# Patient Record
Sex: Female | Born: 1995 | Race: White | Hispanic: No | Marital: Single | Smoking: Never smoker
Health system: Southern US, Community
[De-identification: ages and names within clinical notes are randomized; demographics above are authoritative.]

---

## 2015-06-24 ENCOUNTER — Emergency Department (HOSPITAL_COMMUNITY): Payer: No Typology Code available for payment source

## 2015-06-24 ENCOUNTER — Emergency Department (HOSPITAL_COMMUNITY)
Admission: EM | Admit: 2015-06-24 | Discharge: 2015-06-24 | Disposition: A | Discharge status: Home / Self Care | Payer: No Typology Code available for payment source | Attending: Emergency Medicine | Admitting: Emergency Medicine

## 2015-06-24 ENCOUNTER — Encounter (HOSPITAL_COMMUNITY): Payer: Self-pay | Admitting: *Deleted

## 2015-06-24 DIAGNOSIS — Z793 Long term (current) use of hormonal contraceptives: Secondary | ICD-10-CM | POA: Insufficient documentation

## 2015-06-24 DIAGNOSIS — Z3202 Encounter for pregnancy test, result negative: Secondary | ICD-10-CM | POA: Diagnosis not present

## 2015-06-24 DIAGNOSIS — E860 Dehydration: Secondary | ICD-10-CM | POA: Insufficient documentation

## 2015-06-24 DIAGNOSIS — R55 Syncope and collapse: Secondary | ICD-10-CM | POA: Diagnosis present

## 2015-06-24 LAB — CBC WITH DIFFERENTIAL/PLATELET
Basophils Absolute: 0 10*3/uL (ref 0.0–0.1)
Basophils Relative: 0 % (ref 0–1)
EOS PCT: 1 % (ref 0–5)
Eosinophils Absolute: 0 10*3/uL (ref 0.0–0.7)
HEMATOCRIT: 33.2 % — AB (ref 36.0–46.0)
Hemoglobin: 10.8 g/dL — ABNORMAL LOW (ref 12.0–15.0)
LYMPHS ABS: 1.3 10*3/uL (ref 0.7–4.0)
Lymphocytes Relative: 18 % (ref 12–46)
MCH: 30.1 pg (ref 26.0–34.0)
MCHC: 32.5 g/dL (ref 30.0–36.0)
MCV: 92.5 fL (ref 78.0–100.0)
Monocytes Absolute: 0.3 10*3/uL (ref 0.1–1.0)
Monocytes Relative: 5 % (ref 3–12)
NEUTROS ABS: 5.2 10*3/uL (ref 1.7–7.7)
Neutrophils Relative %: 76 % (ref 43–77)
Platelets: 331 10*3/uL (ref 150–400)
RBC: 3.59 MIL/uL — ABNORMAL LOW (ref 3.87–5.11)
RDW: 13.7 % (ref 11.5–15.5)
WBC: 6.9 10*3/uL (ref 4.0–10.5)

## 2015-06-24 LAB — BASIC METABOLIC PANEL
Anion gap: 8 (ref 5–15)
BUN: 14 mg/dL (ref 6–20)
CO2: 25 mmol/L (ref 22–32)
CREATININE: 0.63 mg/dL (ref 0.44–1.00)
Calcium: 8.7 mg/dL — ABNORMAL LOW (ref 8.9–10.3)
Chloride: 104 mmol/L (ref 101–111)
GFR calc non Af Amer: >60 mL/min (ref >60)
Glucose, Bld: 104 mg/dL — ABNORMAL HIGH (ref 65–99)
Potassium: 3.9 mmol/L (ref 3.5–5.1)
SODIUM: 137 mmol/L (ref 135–145)

## 2015-06-24 LAB — URINALYSIS, ROUTINE W REFLEX MICROSCOPIC
Bilirubin Urine: NEGATIVE
GLUCOSE, UA: NEGATIVE mg/dL
HGB URINE DIPSTICK: NEGATIVE
Ketones, ur: NEGATIVE mg/dL
Leukocytes, UA: NEGATIVE
Nitrite: NEGATIVE
PH: 5.5 (ref 5.0–8.0)
Protein, ur: NEGATIVE mg/dL
Specific Gravity, Urine: 1.025 (ref 1.005–1.030)
Urobilinogen, UA: 0.2 mg/dL (ref 0.0–1.0)

## 2015-06-24 LAB — POC URINE PREG, ED: Preg Test, Ur: NEGATIVE

## 2015-06-24 MED ORDER — SODIUM CHLORIDE 0.9 % IV BOLUS (SEPSIS)
1000.0000 mL | Freq: Once | INTRAVENOUS | Status: AC
  Administered 2015-06-24: 1000 mL via INTRAVENOUS

## 2015-06-24 NOTE — Discharge Instructions (Signed)
Dehydration, Adult °Dehydration is when you lose more fluids from the body than you take in. Vital organs like the kidneys, brain, and heart cannot function without a proper amount of fluids and salt. Any loss of fluids from the body can cause dehydration.  °CAUSES  °· Vomiting. °· Diarrhea. °· Excessive sweating. °· Excessive urine output. °· Fever. °SYMPTOMS  °Mild dehydration °· Thirst. °· Dry lips. °· Slightly dry mouth. °Moderate dehydration °· Very dry mouth. °· Sunken eyes. °· Skin does not bounce back quickly when lightly pinched and released. °· Dark urine and decreased urine production. °· Decreased tear production. °· Headache. °Severe dehydration °· Very dry mouth. °· Extreme thirst. °· Rapid, weak pulse (more than 100 beats per minute at rest). °· Cold hands and feet. °· Not able to sweat in spite of heat and temperature. °· Rapid breathing. °· Blue lips. °· Confusion and lethargy. °· Difficulty being awakened. °· Minimal urine production. °· No tears. °DIAGNOSIS  °Your caregiver will diagnose dehydration based on your symptoms and your exam. Blood and urine tests will help confirm the diagnosis. The diagnostic evaluation should also identify the cause of dehydration. °TREATMENT  °Treatment of mild or moderate dehydration can often be done at home by increasing the amount of fluids that you drink. It is best to drink small amounts of fluid more often. Drinking too much at one time can make vomiting worse. Refer to the home care instructions below. °Severe dehydration needs to be treated at the hospital where you will probably be given intravenous (IV) fluids that contain water and electrolytes. °HOME CARE INSTRUCTIONS  °· Ask your caregiver about specific rehydration instructions. °· Drink enough fluids to keep your urine clear or pale yellow. °· Drink small amounts frequently if you have nausea and vomiting. °· Eat as you normally do. °· Avoid: °¨ Foods or drinks high in sugar. °¨ Carbonated  drinks. °¨ Juice. °¨ Extremely hot or cold fluids. °¨ Drinks with caffeine. °¨ Fatty, greasy foods. °¨ Alcohol. °¨ Tobacco. °¨ Overeating. °¨ Gelatin desserts. °· Wash your hands well to avoid spreading bacteria and viruses. °· Only take over-the-counter or prescription medicines for pain, discomfort, or fever as directed by your caregiver. °· Ask your caregiver if you should continue all prescribed and over-the-counter medicines. °· Keep all follow-up appointments with your caregiver. °SEEK MEDICAL CARE IF: °· You have abdominal pain and it increases or stays in one area (localizes). °· You have a rash, stiff neck, or severe headache. °· You are irritable, sleepy, or difficult to awaken. °· You are weak, dizzy, or extremely thirsty. °SEEK IMMEDIATE MEDICAL CARE IF:  °· You are unable to keep fluids down or you get worse despite treatment. °· You have frequent episodes of vomiting or diarrhea. °· You have blood or green matter (bile) in your vomit. °· You have blood in your stool or your stool looks black and tarry. °· You have not urinated in 6 to 8 hours, or you have only urinated a small amount of very dark urine. °· You have a fever. °· You faint. °MAKE SURE YOU:  °· Understand these instructions. °· Will watch your condition. °· Will get help right away if you are not doing well or get worse. °Document Released: 12/06/2005 Document Revised: 02/28/2012 Document Reviewed: 07/26/2011 °ExitCare® Patient Information ©2015 [employer]. This information is not intended to replace advice given to you by your health care provider. Make sure you discuss any questions you have with your health care   provider.  Near-Syncope Near-syncope (commonly known as near fainting) is sudden weakness, dizziness, or feeling like you might pass out. This can happen when getting up or while standing for a long time. It is caused by a sudden decrease in blood flow to the brain, which can occur for various reasons. Most of the reasons  are not serious.  HOME CARE Watch your condition for any changes.  Have someone stay with you until you feel stable.  If you feel like you are going to pass out:  Lie down right away.  Prop your feet up if you can.  Breathe deeply and steadily.  Move only when the feeling has gone away. Most of the time, this feeling lasts only a few minutes. You may feel tired for several hours.  Drink enough fluids to keep your pee (urine) clear or pale yellow.  If you are taking blood pressure or heart medicine, stand up slowly.  Follow up with your doctor as told. GET HELP RIGHT AWAY IF:   You have a severe headache.  You have unusual pain in the chest, belly (abdomen), or back.  You have bleeding from the mouth or butt (rectum), or you have black or tarry poop (stool).  You feel your heart beat differently than normal, or you have a very fast pulse.  You pass out, or you twitch and shake when you pass out.  You pass out when sitting or lying down.  You feel confused.  You have trouble walking.  You are weak.  You have vision problems. MAKE SURE YOU:   Understand these instructions.  Will watch your condition.  Will get help right away if you are not doing well or get worse. Document Released: 05/24/2008 Document Revised: 12/11/2013 Document Reviewed: 05/11/2013 [facility] Patient Information 2015 [place], [place]. This information is not intended to replace advice given to you by your health care provider. Make sure you discuss any questions you have with your health care provider.

## 2015-06-24 NOTE — ED Notes (Signed)
Patient with no complaints at this time. Respirations even and unlabored. Skin warm/dry. Discharge instructions reviewed with patient at this time. Patient given opportunity to voice concerns/ask questions. IV removed per policy and band-aid applied to site. Patient discharged at this time and left Emergency Department with steady gait.  

## 2015-06-24 NOTE — ED Notes (Signed)
Pt was at work when she had a syncopal episode. Pt states she got hot and passed out. Pt did hit her posterior head. Pt is in c-collar, on and aligned, at this time.  NAD noted. Pt is alert and oriented.   CBG 77, per EMS.

## 2015-06-25 NOTE — ED Provider Notes (Signed)
CSN: [id number]     Arrival date & time 06/24/15  0935 History   First MD Initiated Contact with Patient 06/24/15 [phone]     Chief Complaint  Patient presents with  . Near Syncope     (Consider location/radiation/quality/duration/timing/severity/associated sxs/prior Treatment) Patient is a 19 y.o. female presenting with near-syncope.  Near Syncope Pertinent negatives include no chest pain, fatigue, fever, headaches, nausea, neck pain, numbness, rash, vomiting or weakness.    Christine Briggs is a 19 y.o. female who presents to the Emergency Department complaining of brief syncopal episode this morning while at work.  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  She reports sudden onset of feeling light-headed and hot.  States that she fell back and struck her head on a filing cabinet.  She states that she "passed out" for a few minutes.  She reports dizziness initially, but denies at present.  She also denies headache, vomiting, numbness or visual changes.  No hx of DM.  Denies pregnancy. States she didn't eat very much breakfast before going to work    History reviewed. No pertinent past medical history. History reviewed. No pertinent past surgical history. No family history on file. History  Substance Use Topics  . Smoking status: Never Smoker   . Smokeless tobacco: Not on file  . Alcohol Use: No   OB History    No data available     Review of Systems  Constitutional: Negative for fever, activity change and fatigue.  Eyes: Negative for visual disturbance.  Respiratory: Negative for shortness of breath.   Cardiovascular: Positive for near-syncope. Negative for chest pain.  Gastrointestinal: Negative for nausea and vomiting.  Genitourinary: Negative for dysuria and frequency.  Musculoskeletal: Negative for neck pain and neck stiffness.  Skin: Negative for rash.  Neurological: Positive for dizziness, syncope and light-headedness.  Negative for speech difficulty, weakness, numbness and headaches.  Psychiatric/Behavioral: Negative for confusion and agitation.  All other systems reviewed and are negative.     Allergies  Review of patient's allergies indicates no known allergies.  Home Medications   Prior to Admission medications   Medication Sig Start Date End Date Taking? Authorizing Provider  norethindrone-ethinyl estradiol (BALZIVA) 0.4-35 MG-MCG tablet Take 1 tablet by mouth daily.   Yes Historical Provider, MD   BP 116/65 mmHg  Pulse 65  Temp(Src) 98 F (36.7 C) (Oral)  Resp 14  Ht 5\' 5"  (1.651 m)  Wt 130 lb (58.968 kg)  BMI 21.63 kg/m2  SpO2 100%  LMP 06/17/2015 Physical Exam  Constitutional: She is oriented to person, place, and time. She appears well-developed and well-nourished. No distress.  HENT:  Head: Normocephalic and atraumatic.  Mouth/Throat: Oropharynx is clear and moist.  Eyes: EOM are normal. Pupils are equal, round, and reactive to light.  Neck: Normal range of motion and phonation normal. Neck supple. No spinous process tenderness and no muscular tenderness present. No rigidity.  C collar placed upon arrival.  Cardiovascular: Normal rate, regular rhythm, normal heart sounds and intact distal pulses.   No murmur heard. Pulmonary/Chest: Effort normal and breath sounds normal. No respiratory distress.  Musculoskeletal: Normal range of motion.  Neurological: She is alert and oriented to person, place, and time. She has normal strength. No cranial nerve deficit or sensory deficit. She exhibits normal muscle tone. Coordination and gait normal. GCS eye subscore is 4. GCS verbal subscore is 5. GCS motor subscore is 6.  Reflex Scores:  Tricep reflexes are 2+ on the right side and 2+ on the left side.      Bicep reflexes are 2+ on the right side and 2+ on the left side.      Patellar reflexes are 2+ on the right side and 2+ on the left side.      Achilles reflexes are 2+ on the right side  and 2+ on the left side. Skin: Skin is warm and dry.  Psychiatric: She has a normal mood and affect. Thought content normal.  Nursing note and vitals reviewed.   ED Course  Procedures (including critical care time) Labs Review Labs Reviewed  BASIC METABOLIC PANEL - Abnormal; Notable for the following:    Glucose, Bld 104 (*)    Calcium 8.7 (*)    All other components within normal limits  CBC WITH DIFFERENTIAL/PLATELET - Abnormal; Notable for the following:    RBC 3.59 (*)    Hemoglobin 10.8 (*)    HCT 33.2 (*)    All other components within normal limits  URINALYSIS, ROUTINE W REFLEX MICROSCOPIC (NOT AT [facility])  POC URINE PREG, ED    Imaging Review Dg Cervical Spine Complete  06/24/2015   CLINICAL DATA:  Syncope today pain with a fall.  Initial encounter.  EXAM: CERVICAL SPINE  4+ VIEWS  COMPARISON:  None.  FINDINGS: Vertebral body height and alignment are maintained. The C6-7 level is autologously fused. Neural foramina appear widely patent. Prevertebral soft tissues are unremarkable. Lung apices are clear.  IMPRESSION: No acute finding.  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   Electronically Signed   By: Drusilla Kanner M.D.   On: 06/24/2015 11:15   Ct Head Wo Contrast  06/24/2015   CLINICAL DATA:  Patient had syncopal episode, hitting head on file cabinet  EXAM: CT HEAD WITHOUT CONTRAST  TECHNIQUE: Contiguous axial images were obtained from the base of the skull through the vertex without intravenous contrast.  COMPARISON:  None.  FINDINGS: The ventricles are normal in size and configuration. There is no intracranial mass, hemorrhage, extra-axial fluid collection, or midline shift. Gray-white compartments appear normal. No acute infarct evident. Bony calvarium appears intact. The mastoid air cells are clear. There is rightward deviation of the nasal septum.  IMPRESSION: Rightward deviation of nasal septum. No intracranial mass, hemorrhage, or extra-axial fluid collection. Gray-white compartments  appear normal.   Electronically Signed   By: Bretta Bang III M.D.   On: 06/24/2015 11:20     EKG Interpretation None      MDM   Final diagnoses:  Dehydration    Patient is well appearing.  Feeling better.  Reports asymptomatic at present and reports she's ready for d/c.  Has received IVF's and advised of lab values.  Mildly orthostatic.  Agrees to increase fluid intake for 2-3 days.  Also agrees to arrange PMD f/u regarding Hgb.  Return precautions given.      Pauline Aus, PA-C 06/25/15 2325  Gilda Crease, MD 06/27/15 1536

## 2016-05-13 ENCOUNTER — Emergency Department (HOSPITAL_COMMUNITY)
Admission: EM | Admit: 2016-05-13 | Discharge: 2016-05-13 | Disposition: A | Discharge status: Home / Self Care | Payer: 59 | Attending: Emergency Medicine | Admitting: Emergency Medicine

## 2016-05-13 ENCOUNTER — Emergency Department (HOSPITAL_COMMUNITY): Payer: 59

## 2016-05-13 ENCOUNTER — Encounter (HOSPITAL_COMMUNITY): Payer: Self-pay | Admitting: Emergency Medicine

## 2016-05-13 DIAGNOSIS — Y9389 Activity, other specified: Secondary | ICD-10-CM | POA: Insufficient documentation

## 2016-05-13 DIAGNOSIS — S20312A Abrasion of left front wall of thorax, initial encounter: Secondary | ICD-10-CM | POA: Insufficient documentation

## 2016-05-13 DIAGNOSIS — M25562 Pain in left knee: Secondary | ICD-10-CM | POA: Insufficient documentation

## 2016-05-13 DIAGNOSIS — S0091XA Abrasion of unspecified part of head, initial encounter: Secondary | ICD-10-CM | POA: Insufficient documentation

## 2016-05-13 DIAGNOSIS — Y92410 Unspecified street and highway as the place of occurrence of the external cause: Secondary | ICD-10-CM | POA: Insufficient documentation

## 2016-05-13 DIAGNOSIS — Y999 Unspecified external cause status: Secondary | ICD-10-CM | POA: Diagnosis not present

## 2016-05-13 DIAGNOSIS — R51 Headache: Secondary | ICD-10-CM | POA: Insufficient documentation

## 2016-05-13 LAB — POC URINE PREG, ED: PREG TEST UR: NEGATIVE
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
## 2016-05-13 NOTE — ED Notes (Signed)
Patient cleansed up paper scrubs given. Patient alert oriented x4. Stable with parents at bedside

## 2016-05-13 NOTE — Discharge Instructions (Signed)
X-rays are normal. You'll be sore for several days. Take a shower was soap and water. Tylenol or ibuprofen for pain. Ice pack to painful areas
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
